# Patient Record
Sex: Female | Born: 1970 | Race: White | Hispanic: No | Marital: Married | Smoking: Never smoker
Health system: Southern US, Community
[De-identification: ages and names within clinical notes are randomized; demographics above are authoritative.]

## PROBLEM LIST (undated history)

## (undated) DIAGNOSIS — C50919 Malignant neoplasm of unspecified site of unspecified female breast: Secondary | ICD-10-CM

## (undated) DIAGNOSIS — F32A Depression, unspecified: Secondary | ICD-10-CM

## (undated) DIAGNOSIS — Z1509 Genetic susceptibility to other malignant neoplasm: Secondary | ICD-10-CM

## (undated) DIAGNOSIS — F329 Major depressive disorder, single episode, unspecified: Secondary | ICD-10-CM

## (undated) DIAGNOSIS — Z1501 Genetic susceptibility to malignant neoplasm of breast: Secondary | ICD-10-CM

## (undated) HISTORY — DX: Depression, unspecified: F32.A

## (undated) HISTORY — DX: Genetic susceptibility to other malignant neoplasm: Z15.09

## (undated) HISTORY — DX: Major depressive disorder, single episode, unspecified: F32.9

## (undated) HISTORY — DX: Genetic susceptibility to malignant neoplasm of breast: Z15.01

## (undated) HISTORY — DX: Malignant neoplasm of unspecified site of unspecified female breast: C50.919

## (undated) HISTORY — PX: ABDOMINAL HYSTERECTOMY: SHX81

---

## 2008-02-17 HISTORY — PX: MASTECTOMY: SHX3

## 2009-02-16 HISTORY — PX: TOTAL ABDOMINAL HYSTERECTOMY W/ BILATERAL SALPINGOOPHORECTOMY: SHX83

## 2012-03-17 ENCOUNTER — Other Ambulatory Visit: Payer: Self-pay | Admitting: Family Medicine

## 2012-03-18 ENCOUNTER — Other Ambulatory Visit: Payer: Self-pay | Admitting: Family Medicine

## 2012-03-18 DIAGNOSIS — Z853 Personal history of malignant neoplasm of breast: Secondary | ICD-10-CM

## 2012-03-18 DIAGNOSIS — Z9011 Acquired absence of right breast and nipple: Secondary | ICD-10-CM

## 2012-03-18 DIAGNOSIS — Z9882 Breast implant status: Secondary | ICD-10-CM

## 2012-04-04 ENCOUNTER — Encounter (INDEPENDENT_AMBULATORY_CARE_PROVIDER_SITE_OTHER): Payer: Self-pay | Admitting: General Surgery

## 2012-04-05 ENCOUNTER — Encounter (INDEPENDENT_AMBULATORY_CARE_PROVIDER_SITE_OTHER): Payer: Self-pay | Admitting: Surgery

## 2012-04-05 ENCOUNTER — Ambulatory Visit (INDEPENDENT_AMBULATORY_CARE_PROVIDER_SITE_OTHER): Payer: BC Managed Care – PPO | Admitting: Surgery

## 2012-04-05 VITALS — BP 120/72 | HR 70 | Temp 98.6°F | Resp 12 | Ht 67.5 in | Wt 148.0 lb

## 2012-04-05 DIAGNOSIS — Z853 Personal history of malignant neoplasm of breast: Secondary | ICD-10-CM | POA: Insufficient documentation

## 2012-04-05 DIAGNOSIS — Z1509 Genetic susceptibility to other malignant neoplasm: Secondary | ICD-10-CM

## 2012-04-05 DIAGNOSIS — Z1501 Genetic susceptibility to malignant neoplasm of breast: Secondary | ICD-10-CM

## 2012-04-05 NOTE — Progress Notes (Signed)
NAME: Deborah Rogers       DOB: 1970-12-12           DATE: 04/05/2012       MRN: [id number]  CC:  Chief Complaint  Patient presents with  . 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 She has no problems or concerns on either side.She is BRCAS II + and had a bilateral oophorectomy after the mastectomy. She has recently moved here and wants to establish follow up care here  PFSH: She has had no significant changes since the last visit here.  ROS: There have been no significant changes since the last visit here  EXAM:  VS: BP 120/72  Pulse 70  Temp(Src) 98.6 F (37 C) (Temporal)  Resp 12  Ht 5' 7.5" (1.715 m)  Wt 148 lb (67.132 kg)  BMI 22.82 kg/m2  General: The patient is alert, oriented, generally healthy appearing, NAD. Mood and affect are normal.  Breasts:  S/P right mastectomy with implant reconstructions- an excellent cosmetic result. Left unremarkable  Lymphatics: She has no axillary or supraclavicular adenopathy on either side.  Extremities: Full ROM of the surgical side with no lymphedema noted.  Data Reviewed: Notes for Primary care. Trying to get notes from [facility]  Impression: Doing well, with no evidence of recurrent cancer or new cancer  Plan: Await mamogram, will see in six months

## 2012-04-05 NOTE — Patient Instructions (Signed)
We will schedule a mammogram and try to get records from [facility]

## 2012-05-13 ENCOUNTER — Inpatient Hospital Stay: Admission: RE | Admit: 2012-05-13 | Payer: Self-pay | Source: Ambulatory Visit

## 2012-05-13 ENCOUNTER — Ambulatory Visit
Admission: RE | Admit: 2012-05-13 | Discharge: 2012-05-13 | Disposition: A | Discharge status: Home / Self Care | Payer: BC Managed Care – PPO | Source: Ambulatory Visit | Attending: Family Medicine | Admitting: Family Medicine

## 2012-05-13 DIAGNOSIS — Z9011 Acquired absence of right breast and nipple: Secondary | ICD-10-CM

## 2012-05-13 DIAGNOSIS — Z9882 Breast implant status: Secondary | ICD-10-CM

## 2012-05-13 DIAGNOSIS — Z853 Personal history of malignant neoplasm of breast: Secondary | ICD-10-CM

## 2013-01-06 ENCOUNTER — Telehealth (INDEPENDENT_AMBULATORY_CARE_PROVIDER_SITE_OTHER): Payer: Self-pay

## 2013-01-06 NOTE — Telephone Encounter (Signed)
Left message on machine for patient to call back and ask for me or triage. Patient can be scheduled in any blocked spot on Dr Tenna Child schedule. These were blocked from new patients only. Awaiting call back.

## 2013-01-06 NOTE — Telephone Encounter (Signed)
Appt made

## 2013-01-06 NOTE — Telephone Encounter (Signed)
Pt calling to make her 6 mo LTF appt with Dr Jamey Ripa. I saw block on appts in Dec. Pt wants to see him if possible understanding he is retiring. Pt can be reached at [phone] or [phone].

## 2013-02-03 ENCOUNTER — Ambulatory Visit (INDEPENDENT_AMBULATORY_CARE_PROVIDER_SITE_OTHER): Payer: BC Managed Care – PPO | Admitting: Surgery

## 2013-02-07 ENCOUNTER — Ambulatory Visit (INDEPENDENT_AMBULATORY_CARE_PROVIDER_SITE_OTHER): Payer: BC Managed Care – PPO | Admitting: Surgery

## 2013-02-07 ENCOUNTER — Encounter (INDEPENDENT_AMBULATORY_CARE_PROVIDER_SITE_OTHER): Payer: Self-pay

## 2013-02-07 ENCOUNTER — Encounter (INDEPENDENT_AMBULATORY_CARE_PROVIDER_SITE_OTHER): Payer: Self-pay | Admitting: Surgery

## 2013-02-07 VITALS — BP 114/78 | HR 60 | Temp 97.5°F | Resp 14 | Ht 67.0 in | Wt 147.2 lb

## 2013-02-07 DIAGNOSIS — Z1501 Genetic susceptibility to malignant neoplasm of breast: Secondary | ICD-10-CM

## 2013-02-07 DIAGNOSIS — Z853 Personal history of malignant neoplasm of breast: Secondary | ICD-10-CM

## 2013-02-07 NOTE — Progress Notes (Signed)
NAME: Deborah Rogers       DOB: 1970-04-03           DATE: 02/07/2013       MRN: [id number]  CC:  Chief Complaint  Patient presents with  . 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 She has no problems or concerns on either side.She is BRCAS II + and had a bilateral oophorectomy after the mastectomy. She has recently moved here and wants to establish follow up care here  PFSH: She has had no significant changes since the last visit here.  ROS: There have been no significant changes since the last visit here  EXAM:  VS: BP 114/78  Pulse 60  Temp(Src) 97.5 F (36.4 C) (Temporal)  Resp 14  Ht 5\' 7"  (1.702 m)  Wt 147 lb 3.2 oz (66.769 kg)  BMI 23.05 kg/m2  General: The patient is alert, oriented, generally healthy appearing, NAD. Mood and affect are normal.  Breasts:  S/P right mastectomy with implant reconstructions- an excellent cosmetic result. Left unremarkable  Lymphatics: She has no axillary or supraclavicular adenopathy on either side.  Extremities: Full ROM of the surgical side with no lymphedema noted.  Data Reviewed: MAMMOGRAPHIC UNILATERAL LEFT DIGITAL SCREENING WITH CAD  DIGITAL BREAST TOMOSYNTHESIS  Digital breast tomosynthesis images are acquired in two  projections. These images are reviewed in combination with the  digital mammogram, confirming the findings below.  A subpectoral silicone implant is present. Implant included and  implant displaced views are obtained.  Comparison: 03/20/2011 from [facility] in [place], [place]  FINDINGS:  ACR Breast Density Category 3: The breast tissue is heterogeneously  dense.  There is no suspicious dominant mass, architectural distortion, or  calcification to suggest malignancy.  Images were processed with CAD.  IMPRESSION:  No mammographic evidence  of malignancy.  A result letter of this screening mammogram will be mailed directly  to the patient.  RECOMMENDATION:  Screening mammogram in one year. (Code:[id number]-01Y)  BI-RADS CATEGORY 1: Negative.    Impression: Doing well, with no evidence of recurrent cancer or new cancer BRCA 2  Personal history of breast cancer  Plan: Given BRCA 2 status  And implants pt needs MRI every other year.  Will arrange. Return 1 year.

## 2013-02-07 NOTE — Patient Instructions (Signed)
Return 1 year.  Need imaging in march.  Will discuss MRI vs mammogram with radiology and set up appropriate tests.

## 2013-04-07 ENCOUNTER — Other Ambulatory Visit: Payer: Self-pay

## 2013-04-07 DIAGNOSIS — Z1231 Encounter for screening mammogram for malignant neoplasm of breast: Secondary | ICD-10-CM

## 2013-05-15 ENCOUNTER — Ambulatory Visit
Admission: RE | Admit: 2013-05-15 | Discharge: 2013-05-15 | Disposition: A | Discharge status: Home / Self Care | Payer: BC Managed Care – PPO | Source: Ambulatory Visit

## 2013-05-15 DIAGNOSIS — Z1231 Encounter for screening mammogram for malignant neoplasm of breast: Secondary | ICD-10-CM

## 2013-12-15 ENCOUNTER — Ambulatory Visit (INDEPENDENT_AMBULATORY_CARE_PROVIDER_SITE_OTHER): Payer: BC Managed Care – PPO | Admitting: Family Medicine

## 2013-12-15 ENCOUNTER — Encounter: Payer: Self-pay | Admitting: Family Medicine

## 2013-12-15 ENCOUNTER — Encounter (INDEPENDENT_AMBULATORY_CARE_PROVIDER_SITE_OTHER): Payer: Self-pay

## 2013-12-15 VITALS — BP 129/85 | Ht 67.5 in | Wt 145.0 lb

## 2013-12-15 DIAGNOSIS — M79661 Pain in right lower leg: Secondary | ICD-10-CM | POA: Diagnosis not present

## 2013-12-15 DIAGNOSIS — S86811A Strain of other muscle(s) and tendon(s) at lower leg level, right leg, initial encounter: Secondary | ICD-10-CM

## 2013-12-15 DIAGNOSIS — S86111A Strain of other muscle(s) and tendon(s) of posterior muscle group at lower leg level, right leg, initial encounter: Secondary | ICD-10-CM | POA: Insufficient documentation

## 2013-12-15 NOTE — Progress Notes (Signed)
Deborah Rogers is a 43 y.o. female who presents today for R calf pain.  R Calf pain - Pt states this has been ongoing now for about 2 months with acute pain yesterday during a run.  Pain is located midcalf region and was insidious in onset without an acute injury she noticed.  Denies previous injury to either leg or previous calf.  Pain prior to injury yesterday described as achy, with activity, not really with rest.  She had not done anything for the achiness during the last couple of months and did not notice that longer periods of rest helped too much.  She has been running about 3-5 miles 2 x per week with a 5-8 mile run on the weekend.  As well, she is running on concrete/black top and changed her shoes (S[place]cony, 8 mm lift) about 2 weeks ago. Yesterday, she was running and during a short ascent, she started to have sharp pain in the lateral aspect of her calf that caused her to stop running.  She denied having any effusion or ecchymosis but states weakness.  She did ice last night which helped and took some ibuprofen.  Denies paresthesias into her lower extremities or any focal bony tenderness.   Past Medical History  Diagnosis Date  . Breast cancer   . BRCA2 positive   . Depression    PSH - Non contributory   Family History  Problem Relation Age of Onset  . Breast cancer Paternal Grandfather   . Colon cancer Paternal Grandfather   . Lung cancer Paternal Grandfather   . Cancer Paternal Grandfather     breast and lung  . Colon cancer Paternal Grandmother   . Colon cancer Paternal Aunt   . Cancer Paternal Aunt     breast    Current Outpatient Prescriptions on File Prior to Visit  Medication Sig Dispense Refill  . buPROPion (WELLBUTRIN XL) 150 MG 24 hr tablet Take 150 mg by mouth daily.      . fexofenadine (ALLEGRA) 180 MG tablet Take 180 mg by mouth daily.      . fluticasone (FLONASE) 50 MCG/ACT nasal spray Place 2 sprays into the nose daily.      Marland Kitchen guaiFENesin (MUCINEX) 600 MG 12 hr  tablet Take 1,200 mg by mouth 2 (two) times daily.       No current facility-administered medications on file prior to visit.    ROS: Per HPI.  All other systems reviewed and are negative.   Physical Exam Filed Vitals:   12/15/13 0834  BP: 129/85    Physical Examination: General appearance - alert, well appearing, and in no distress Musculoskeletal - R Calf -  Normal inspection w/o ecchymosis, obvious deformity, or erythema No TTP at the lateral/medial head of the gastroc at proximal insertion or belly of muscle.  No achilles TTP at insertion ROM normal in both knee and ankle  MS 5/5 in both ER/IR with concentric/eccentric testing  Skin - normal coloration and turgor, no rashes, no suspicious skin lesions noted LE: + 2 DP/PT B/L LE  Neuro intact LE    [place] R Calf long/short axis - achilles tendon intact w/o effusion or strain.  Medial head of gastroc with normal fibrillations and no hypoechoic region noted.  Lateral head at he MSK junction showing small hypoechoic changes in long axis measuring around 2 cm without gross hematoma or effusion noted.

## 2013-12-15 NOTE — Assessment & Plan Note (Signed)
Grade 1-2 strain of the lateral head of the gastroc near the medial border at the musculotendinous junction - Conservative management with eccentric heel exercises - Body Helix compression sleeve to the area - Slow return to running in the next 1-2 weeks - Ice/ibuprofen as neeeded - Consider RTC if minimal improvement in two weeks for gait analysis  - Most likely with heel strike, lateral side with rolling in of her forefoot into forefoot varus causing her to put more stress on her lateral posterior complex -posterior lateral heel lift with EVA material today

## 2013-12-18 NOTE — Progress Notes (Signed)
Orin Attending Note: I have seen and examined this patient. I have discussed this patient with the resident and reviewed the assessment and plan as documented above. I agree with the resident's findings and plan. Patient with moderate varus forefoot deformity likely causing leg pain issues, esp when she increases mileage. Small lateral heel post applied to her existing insole, stretching and icing discussed. Might benefit from custom molded orthotics---I would want to see her gait (ideally in non-injured state).

## 2014-01-26 ENCOUNTER — Other Ambulatory Visit (INDEPENDENT_AMBULATORY_CARE_PROVIDER_SITE_OTHER): Payer: Self-pay | Admitting: Surgery

## 2014-01-26 DIAGNOSIS — Z1501 Genetic susceptibility to malignant neoplasm of breast: Secondary | ICD-10-CM

## 2014-01-26 DIAGNOSIS — Z1509 Genetic susceptibility to other malignant neoplasm: Principal | ICD-10-CM

## 2014-02-06 ENCOUNTER — Emergency Department (HOSPITAL_COMMUNITY)
Admission: EM | Admit: 2014-02-06 | Discharge: 2014-02-06 | Disposition: A | Discharge status: Home / Self Care | Payer: BC Managed Care – PPO | Source: Home / Self Care | Attending: Family Medicine | Admitting: Family Medicine

## 2014-02-06 ENCOUNTER — Encounter (HOSPITAL_COMMUNITY): Payer: Self-pay | Admitting: *Deleted

## 2014-02-06 DIAGNOSIS — J0101 Acute recurrent maxillary sinusitis: Secondary | ICD-10-CM

## 2014-02-06 MED ORDER — HYDROCOD POLST-CHLORPHEN POLST 10-8 MG/5ML PO LQCR: 5.0000 mL | Freq: Two times a day (BID) | ORAL | Status: AC | PRN

## 2014-02-06 MED ORDER — MINOCYCLINE HCL 100 MG PO CAPS: 100.0000 mg | ORAL_CAPSULE | Freq: Two times a day (BID) | ORAL | Status: DC

## 2014-02-06 MED ORDER — IPRATROPIUM BROMIDE 0.06 % NA SOLN: 2.0000 | Freq: Four times a day (QID) | NASAL | Status: DC

## 2014-02-06 NOTE — ED Notes (Signed)
Pt  Reports  Symptoms  Of     Cough   /  Congested         Pain  r  Side  Chest           Nasal  stuffyness               Pt  Reports  The  Pain  Is  Worse   When  She  Coughs

## 2014-02-06 NOTE — ED Provider Notes (Signed)
CSN: [id number]     Arrival date & time 02/06/14  1204 History   First MD Initiated Contact with Patient 02/06/14 1252     Chief Complaint  Patient presents with  . URI   (Consider location/radiation/quality/duration/timing/severity/associated sxs/prior Treatment) Patient is a 43 y.o. female presenting with URI. The history is provided by the patient.  URI Presenting symptoms: congestion, cough, fever and rhinorrhea   Severity:  Mild Duration:  8 days Chronicity:  New Associated symptoms: sinus pain     Past Medical History  Diagnosis Date  . Breast cancer   . BRCA2 positive   . Depression    Past Surgical History  Procedure Laterality Date  . Mastectomy Right 2010  . Total abdominal hysterectomy w/ bilateral salpingoophorectomy  2011  . Abdominal hysterectomy     Family History  Problem Relation Age of Onset  . Breast cancer Paternal Grandfather   . Colon cancer Paternal Grandfather   . Lung cancer Paternal Grandfather   . Cancer Paternal Grandfather     breast and lung  . Colon cancer Paternal Grandmother   . Colon cancer Paternal Aunt   . Cancer Paternal Aunt     breast   History  Substance Use Topics  . Smoking status: Never Smoker   . Smokeless tobacco: Not on file  . Alcohol Use: Yes     Comment: one every evening   OB History    No data available     Review of Systems  Constitutional: Positive for fever. Negative for chills and appetite change.  HENT: Positive for congestion, postnasal drip and rhinorrhea.   Respiratory: Positive for cough. Negative for chest tightness.   Cardiovascular: Positive for chest pain. Negative for palpitations and leg swelling.    Allergies  Review of patient's allergies indicates no known allergies.  Home Medications   Prior to Admission medications   Medication Sig Start Date End Date Taking? Authorizing Provider  buPROPion (WELLBUTRIN XL) 150 MG 24 hr tablet Take 150 mg by mouth daily.    Historical Provider, MD   chlorpheniramine-HYDROcodone (TUSSIONEX PENNKINETIC ER) 10-8 MG/5ML LQCR Take 5 mLs by mouth every 12 (twelve) hours as needed for cough. 02/06/14   Billy Fischer, MD  fexofenadine (ALLEGRA) 180 MG tablet Take 180 mg by mouth daily.    Historical Provider, MD  fluticasone (FLONASE) 50 MCG/ACT nasal spray Place 2 sprays into the nose daily.    Historical Provider, MD  Darcey Nora SUSP  11/22/13   Historical Provider, MD  guaiFENesin (MUCINEX) 600 MG 12 hr tablet Take 1,200 mg by mouth 2 (two) times daily.    Historical Provider, MD  ipratropium (ATROVENT) 0.06 % nasal spray Place 2 sprays into both nostrils 4 (four) times daily. 02/06/14   Billy Fischer, MD  minocycline (MINOCIN,DYNACIN) 100 MG capsule Take 1 capsule (100 mg total) by mouth 2 (two) times daily. 02/06/14   Billy Fischer, MD   BP 131/80 mmHg  Pulse 68  Temp(Src) 98.1 F (36.7 C) (Oral)  Resp 12  SpO2 96% Physical Exam  Constitutional: She is oriented to person, place, and time. She appears well-developed and well-nourished. No distress.  HENT:  Right Ear: External ear normal.  Left Ear: External ear normal.  Mouth/Throat: Oropharynx is clear and moist.  Eyes: Pupils are equal, round, and reactive to light.  Neck: Normal range of motion. Neck supple.  Cardiovascular: Normal heart sounds and intact distal pulses.   Pulmonary/Chest: Effort normal and breath sounds normal.  Lymphadenopathy:    She has no cervical adenopathy.  Neurological: She is alert and oriented to person, place, and time.  Skin: Skin is warm and dry.  Nursing note and vitals reviewed.   ED Course  Procedures (including critical care time) Labs Review Labs Reviewed - No data to display  Imaging Review No results found.   MDM   1. Acute recurrent maxillary sinusitis        Billy Fischer, MD 02/06/14 1318

## 2014-02-06 NOTE — Discharge Instructions (Signed)
Drink plenty of fluids as discussed, use medicine as prescribed, and mucinex or delsym for cough. Return or see your doctor if further problems °

## 2014-02-21 ENCOUNTER — Other Ambulatory Visit: Payer: BC Managed Care – PPO

## 2014-02-21 ENCOUNTER — Ambulatory Visit
Admission: RE | Admit: 2014-02-21 | Discharge: 2014-02-21 | Disposition: A | Discharge status: Home / Self Care | Payer: BLUE CROSS/BLUE SHIELD | Source: Ambulatory Visit | Attending: Surgery | Admitting: Surgery

## 2014-02-21 DIAGNOSIS — Z1501 Genetic susceptibility to malignant neoplasm of breast: Secondary | ICD-10-CM

## 2014-02-21 DIAGNOSIS — Z1509 Genetic susceptibility to other malignant neoplasm: Principal | ICD-10-CM

## 2014-02-21 MED ORDER — GADOBENATE DIMEGLUMINE 529 MG/ML IV SOLN
14.0000 mL | Freq: Once | INTRAVENOUS | Status: AC | PRN
  Administered 2014-02-21: 14 mL via INTRAVENOUS

## 2015-05-21 DIAGNOSIS — C44619 Basal cell carcinoma of skin of left upper limb, including shoulder: Secondary | ICD-10-CM | POA: Diagnosis not present

## 2015-06-11 DIAGNOSIS — L720 Epidermal cyst: Secondary | ICD-10-CM | POA: Diagnosis not present

## 2015-06-11 DIAGNOSIS — L821 Other seborrheic keratosis: Secondary | ICD-10-CM | POA: Diagnosis not present

## 2015-06-11 DIAGNOSIS — D225 Melanocytic nevi of trunk: Secondary | ICD-10-CM | POA: Diagnosis not present

## 2015-06-11 DIAGNOSIS — D1801 Hemangioma of skin and subcutaneous tissue: Secondary | ICD-10-CM | POA: Diagnosis not present

## 2015-06-11 DIAGNOSIS — Z85828 Personal history of other malignant neoplasm of skin: Secondary | ICD-10-CM | POA: Diagnosis not present

## 2015-11-11 DIAGNOSIS — Z0000 Encounter for general adult medical examination without abnormal findings: Secondary | ICD-10-CM | POA: Diagnosis not present

## 2015-11-11 DIAGNOSIS — Z1231 Encounter for screening mammogram for malignant neoplasm of breast: Secondary | ICD-10-CM | POA: Diagnosis not present

## 2015-11-11 DIAGNOSIS — Z1501 Genetic susceptibility to malignant neoplasm of breast: Secondary | ICD-10-CM | POA: Diagnosis not present

## 2015-11-11 DIAGNOSIS — N959 Unspecified menopausal and perimenopausal disorder: Secondary | ICD-10-CM | POA: Diagnosis not present

## 2015-11-12 ENCOUNTER — Other Ambulatory Visit: Payer: Self-pay | Admitting: Family Medicine

## 2015-11-14 ENCOUNTER — Other Ambulatory Visit: Payer: Self-pay | Admitting: Family Medicine

## 2015-11-14 DIAGNOSIS — Z853 Personal history of malignant neoplasm of breast: Secondary | ICD-10-CM

## 2015-11-14 DIAGNOSIS — Z1231 Encounter for screening mammogram for malignant neoplasm of breast: Secondary | ICD-10-CM

## 2015-11-14 DIAGNOSIS — Z1501 Genetic susceptibility to malignant neoplasm of breast: Secondary | ICD-10-CM

## 2015-11-21 ENCOUNTER — Ambulatory Visit
Admission: RE | Admit: 2015-11-21 | Discharge: 2015-11-21 | Disposition: A | Discharge status: Home / Self Care | Payer: BLUE CROSS/BLUE SHIELD | Source: Ambulatory Visit | Attending: Family Medicine | Admitting: Family Medicine

## 2015-11-21 ENCOUNTER — Other Ambulatory Visit: Payer: Self-pay | Admitting: Family Medicine

## 2015-11-21 DIAGNOSIS — Z853 Personal history of malignant neoplasm of breast: Secondary | ICD-10-CM

## 2015-11-21 DIAGNOSIS — Z1501 Genetic susceptibility to malignant neoplasm of breast: Secondary | ICD-10-CM

## 2015-11-21 DIAGNOSIS — Z1231 Encounter for screening mammogram for malignant neoplasm of breast: Secondary | ICD-10-CM

## 2015-12-02 DIAGNOSIS — M8588 Other specified disorders of bone density and structure, other site: Secondary | ICD-10-CM | POA: Diagnosis not present

## 2015-12-02 DIAGNOSIS — E2839 Other primary ovarian failure: Secondary | ICD-10-CM | POA: Diagnosis not present

## 2015-12-20 DIAGNOSIS — J069 Acute upper respiratory infection, unspecified: Secondary | ICD-10-CM | POA: Diagnosis not present

## 2015-12-20 DIAGNOSIS — M791 Myalgia: Secondary | ICD-10-CM | POA: Diagnosis not present

## 2015-12-20 DIAGNOSIS — M85852 Other specified disorders of bone density and structure, left thigh: Secondary | ICD-10-CM | POA: Diagnosis not present

## 2015-12-30 ENCOUNTER — Ambulatory Visit
Admission: RE | Admit: 2015-12-30 | Discharge: 2015-12-30 | Disposition: A | Discharge status: Home / Self Care | Payer: BLUE CROSS/BLUE SHIELD | Source: Ambulatory Visit | Attending: Family Medicine | Admitting: Family Medicine

## 2015-12-30 DIAGNOSIS — Z853 Personal history of malignant neoplasm of breast: Secondary | ICD-10-CM

## 2015-12-30 DIAGNOSIS — Z1501 Genetic susceptibility to malignant neoplasm of breast: Secondary | ICD-10-CM

## 2015-12-30 DIAGNOSIS — Z803 Family history of malignant neoplasm of breast: Secondary | ICD-10-CM | POA: Diagnosis not present

## 2015-12-30 MED ORDER — GADOBENATE DIMEGLUMINE 529 MG/ML IV SOLN
14.0000 mL | Freq: Once | INTRAVENOUS | Status: AC | PRN
  Administered 2015-12-30: 14 mL via INTRAVENOUS

## 2016-11-17 ENCOUNTER — Other Ambulatory Visit: Payer: Self-pay | Admitting: Family Medicine

## 2016-11-17 DIAGNOSIS — Z1231 Encounter for screening mammogram for malignant neoplasm of breast: Secondary | ICD-10-CM

## 2016-11-30 ENCOUNTER — Ambulatory Visit
Admission: RE | Admit: 2016-11-30 | Discharge: 2016-11-30 | Disposition: A | Discharge status: Home / Self Care | Payer: BLUE CROSS/BLUE SHIELD | Source: Ambulatory Visit | Attending: Family Medicine | Admitting: Family Medicine

## 2016-11-30 DIAGNOSIS — Z1231 Encounter for screening mammogram for malignant neoplasm of breast: Secondary | ICD-10-CM

## 2016-12-02 DIAGNOSIS — Z13220 Encounter for screening for lipoid disorders: Secondary | ICD-10-CM | POA: Diagnosis not present

## 2016-12-02 DIAGNOSIS — Z0000 Encounter for general adult medical examination without abnormal findings: Secondary | ICD-10-CM | POA: Diagnosis not present

## 2016-12-02 DIAGNOSIS — Z131 Encounter for screening for diabetes mellitus: Secondary | ICD-10-CM | POA: Diagnosis not present

## 2016-12-02 DIAGNOSIS — E7800 Pure hypercholesterolemia, unspecified: Secondary | ICD-10-CM | POA: Diagnosis not present

## 2016-12-02 DIAGNOSIS — Z23 Encounter for immunization: Secondary | ICD-10-CM | POA: Diagnosis not present

## 2016-12-02 DIAGNOSIS — M859 Disorder of bone density and structure, unspecified: Secondary | ICD-10-CM | POA: Diagnosis not present

## 2017-04-23 DIAGNOSIS — J029 Acute pharyngitis, unspecified: Secondary | ICD-10-CM | POA: Diagnosis not present

## 2017-06-29 DIAGNOSIS — J018 Other acute sinusitis: Secondary | ICD-10-CM | POA: Diagnosis not present

## 2017-06-29 DIAGNOSIS — H10022 Other mucopurulent conjunctivitis, left eye: Secondary | ICD-10-CM | POA: Diagnosis not present

## 2017-10-13 DIAGNOSIS — J209 Acute bronchitis, unspecified: Secondary | ICD-10-CM | POA: Diagnosis not present

## 2017-10-26 ENCOUNTER — Other Ambulatory Visit: Payer: Self-pay | Admitting: Family Medicine

## 2017-10-26 DIAGNOSIS — Z1231 Encounter for screening mammogram for malignant neoplasm of breast: Secondary | ICD-10-CM

## 2017-12-06 ENCOUNTER — Ambulatory Visit
Admission: RE | Admit: 2017-12-06 | Discharge: 2017-12-06 | Disposition: A | Discharge status: Home / Self Care | Payer: BLUE CROSS/BLUE SHIELD | Source: Ambulatory Visit | Attending: Family Medicine | Admitting: Family Medicine

## 2017-12-06 DIAGNOSIS — Z1231 Encounter for screening mammogram for malignant neoplasm of breast: Secondary | ICD-10-CM

## 2017-12-16 DIAGNOSIS — E7800 Pure hypercholesterolemia, unspecified: Secondary | ICD-10-CM | POA: Diagnosis not present

## 2017-12-16 DIAGNOSIS — Z0000 Encounter for general adult medical examination without abnormal findings: Secondary | ICD-10-CM | POA: Diagnosis not present

## 2017-12-16 DIAGNOSIS — M545 Low back pain: Secondary | ICD-10-CM | POA: Diagnosis not present

## 2017-12-16 DIAGNOSIS — M8588 Other specified disorders of bone density and structure, other site: Secondary | ICD-10-CM | POA: Diagnosis not present

## 2018-11-03 ENCOUNTER — Other Ambulatory Visit: Payer: Self-pay | Admitting: Family Medicine

## 2018-11-03 DIAGNOSIS — Z1231 Encounter for screening mammogram for malignant neoplasm of breast: Secondary | ICD-10-CM

## 2018-12-20 ENCOUNTER — Ambulatory Visit: Payer: BLUE CROSS/BLUE SHIELD

## 2018-12-21 ENCOUNTER — Other Ambulatory Visit: Payer: Self-pay | Admitting: Family Medicine

## 2018-12-21 DIAGNOSIS — M8588 Other specified disorders of bone density and structure, other site: Secondary | ICD-10-CM

## 2019-01-03 ENCOUNTER — Other Ambulatory Visit: Payer: Self-pay

## 2019-01-03 DIAGNOSIS — Z20822 Contact with and (suspected) exposure to covid-19: Secondary | ICD-10-CM

## 2019-01-05 LAB — NOVEL CORONAVIRUS, NAA: SARS-CoV-2, NAA: NOT DETECTED

## 2019-02-23 ENCOUNTER — Other Ambulatory Visit: Payer: Self-pay

## 2019-02-23 ENCOUNTER — Ambulatory Visit
Admission: RE | Admit: 2019-02-23 | Discharge: 2019-02-23 | Disposition: A | Discharge status: Home / Self Care | Payer: BLUE CROSS/BLUE SHIELD | Source: Ambulatory Visit | Attending: Family Medicine | Admitting: Family Medicine

## 2019-02-23 DIAGNOSIS — Z1231 Encounter for screening mammogram for malignant neoplasm of breast: Secondary | ICD-10-CM

## 2019-02-23 DIAGNOSIS — M8588 Other specified disorders of bone density and structure, other site: Secondary | ICD-10-CM

## 2020-01-04 ENCOUNTER — Other Ambulatory Visit: Payer: Self-pay | Admitting: Family Medicine

## 2020-01-04 DIAGNOSIS — Z1501 Genetic susceptibility to malignant neoplasm of breast: Secondary | ICD-10-CM

## 2020-01-04 DIAGNOSIS — Z853 Personal history of malignant neoplasm of breast: Secondary | ICD-10-CM

## 2020-02-07 ENCOUNTER — Ambulatory Visit
Admission: RE | Admit: 2020-02-07 | Discharge: 2020-02-07 | Disposition: A | Discharge status: Home / Self Care | Payer: Commercial Managed Care - PPO | Source: Ambulatory Visit | Attending: Family Medicine | Admitting: Family Medicine

## 2020-02-07 DIAGNOSIS — Z853 Personal history of malignant neoplasm of breast: Secondary | ICD-10-CM

## 2020-02-07 DIAGNOSIS — Z1501 Genetic susceptibility to malignant neoplasm of breast: Secondary | ICD-10-CM

## 2020-02-07 MED ORDER — GADOBUTROL 1 MMOL/ML IV SOLN
6.0000 mL | Freq: Once | INTRAVENOUS | Status: AC | PRN
  Administered 2020-02-07: 6 mL via INTRAVENOUS

## 2021-01-16 ENCOUNTER — Other Ambulatory Visit: Payer: Self-pay | Admitting: Family Medicine

## 2021-01-16 DIAGNOSIS — Z1501 Genetic susceptibility to malignant neoplasm of breast: Secondary | ICD-10-CM

## 2021-01-16 DIAGNOSIS — Z853 Personal history of malignant neoplasm of breast: Secondary | ICD-10-CM

## 2021-03-10 ENCOUNTER — Other Ambulatory Visit: Payer: Self-pay | Admitting: Family Medicine

## 2021-03-10 ENCOUNTER — Ambulatory Visit
Admission: RE | Admit: 2021-03-10 | Discharge: 2021-03-10 | Disposition: A | Discharge status: Home / Self Care | Payer: No Typology Code available for payment source | Source: Ambulatory Visit | Attending: Family Medicine | Admitting: Family Medicine

## 2021-03-10 DIAGNOSIS — Z1501 Genetic susceptibility to malignant neoplasm of breast: Secondary | ICD-10-CM

## 2021-03-10 DIAGNOSIS — Z853 Personal history of malignant neoplasm of breast: Secondary | ICD-10-CM

## 2021-05-20 ENCOUNTER — Encounter: Payer: Self-pay | Admitting: Plastic Surgery

## 2021-05-20 ENCOUNTER — Ambulatory Visit (INDEPENDENT_AMBULATORY_CARE_PROVIDER_SITE_OTHER): Payer: No Typology Code available for payment source | Admitting: Plastic Surgery

## 2021-05-20 VITALS — BP 130/89 | HR 66 | Ht 67.0 in | Wt 154.8 lb

## 2021-05-20 DIAGNOSIS — Z1509 Genetic susceptibility to other malignant neoplasm: Secondary | ICD-10-CM

## 2021-05-20 DIAGNOSIS — Z1501 Genetic susceptibility to malignant neoplasm of breast: Secondary | ICD-10-CM

## 2021-05-20 DIAGNOSIS — Z853 Personal history of malignant neoplasm of breast: Secondary | ICD-10-CM

## 2021-05-20 NOTE — Progress Notes (Signed)
? ?  Patient ID: Deborah Rogers, female    DOB: 04/10/1970, 51 y.o.   MRN: [id number] ? ? ?Chief Complaint  ?Patient presents with  ? Advice Only  ? Breast Problem  ? ? ?The patient is a 51 year old female here for evaluation of her breasts.  13 years ago she underwent a right mastectomy in [place].  She had reconstruction and bilateral placement of silicone implants.  She thinks that they are both under the muscle.  She is not sure about the size.  In the last year she has noticed a different feeling on the right breast.  It is not exactly pain but may be a discomfort.  She has a grade 2 ptosis of the left breast.  She also has a right nipple areola tattoo.  She is not a smoker, not on blood thinner and does not have diabetes.  She is otherwise in good health.  She considers herself a C cup.  She is living in [place] and would like to be established with a Psychologist, sport and exercise here.  She is 5 feet 7 inches tall and weighs 154 pounds.  She gets mammograms and MRIs every other year.  She is an avid horse rider. ? ? ?Review of Systems  ?Constitutional: Negative.   ?HENT: Negative.    ?Eyes: Negative.   ?Respiratory: Negative.  Negative for chest tightness and shortness of breath.   ?Cardiovascular: Negative.   ?Gastrointestinal: Negative.   ?Endocrine: Negative.   ?Genitourinary: Negative.   ?Musculoskeletal: Negative.   ?Skin: Negative.   ? ?Past Medical History:  ?Diagnosis Date  ? BRCA2 positive   ? Breast cancer ([place])   ? Depression   ?  ?Past Surgical History:  ?Procedure Laterality Date  ? ABDOMINAL HYSTERECTOMY    ? MASTECTOMY Right 2010  ? TOTAL ABDOMINAL HYSTERECTOMY W/ BILATERAL SALPINGOOPHORECTOMY  2011  ?  ? ? ?Current Outpatient Medications:  ?  chlorpheniramine-HYDROcodone (TUSSIONEX PENNKINETIC ER) 10-8 MG/5ML LQCR, Take 5 mLs by mouth every 12 (twelve) hours as needed for cough., Disp: 115 mL, Rfl: 0 ?  fexofenadine (ALLEGRA) 180 MG tablet, Take 180 mg by mouth daily., Disp: , Rfl:  ?   fluticasone (FLONASE) 50 MCG/ACT nasal spray, Place 2 sprays into the nose daily., Disp: , Rfl:  ?  guaiFENesin (MUCINEX) 600 MG 12 hr tablet, Take 1,200 mg by mouth 2 (two) times daily., Disp: , Rfl:   ? ?Objective:  ? ?Vitals:  ? 05/20/21 1132  ?BP: 130/89  ?Pulse: 66  ?SpO2: 98%  ? ? ?Physical Exam ?Constitutional:   ?   Appearance: Normal appearance.  ?HENT:  ?   Head: Normocephalic and atraumatic.  ?Cardiovascular:  ?   Rate and Rhythm: Normal rate.  ?   Pulses: Normal pulses.  ?Pulmonary:  ?   Effort: Pulmonary effort is normal.  ?Abdominal:  ?   General: There is no distension.  ?   Palpations: Abdomen is soft.  ?   Tenderness: There is no abdominal tenderness.  ?Musculoskeletal:     ?   General: No swelling or deformity.  ?Skin: ?   General: Skin is warm.  ?   Capillary Refill: Capillary refill takes less than 2 seconds.  ?   Coloration: Skin is not jaundiced or pale.  ?   Findings: No bruising.  ?Neurological:  ?   Mental Status: She is alert and oriented to person, place, and time.  ?Psychiatric:     ?   Mood and Affect:  Mood normal.     ?   Behavior: Behavior normal.     ?   Thought Content: Thought content normal.     ?   Judgment: Judgment normal.  ? ? ?Assessment & Plan:  ?Hx DCIS, Right breast ? ?BRCA2 positive ? ? ?I saw in her chart that she is BRCA2 positive.  We will talk more about this at her next visit.  Right now we will get a release of information to get her information from [place].  She should go ahead with her MRI for this year.  I would like to see her back in a year.  We talked about the need to do an exchange.  This does not have to be rushed and could be done in the next year or so when it is good for her.  It might be a good time to do a mastopexy of the left breast.  The patient knows to give me a call if she has any questions or concerns in the interim. ? ?Pictures were obtained of the patient and placed in the chart with the patient's or guardian's permission. ? ? ? ?Loel Lofty  Vali Capano, DO ?

## 2021-11-26 ENCOUNTER — Other Ambulatory Visit: Payer: No Typology Code available for payment source

## 2021-12-09 ENCOUNTER — Telehealth: Payer: Self-pay | Admitting: Plastic Surgery

## 2021-12-09 NOTE — Telephone Encounter (Signed)
T ptn advised Dr. Keturah Barre asked to see ptn for follow up in one year and advised to proceed with her yearly MRI- but she did not order an MRI - it appears dr. Baldomero Lamy office did

## 2022-01-27 ENCOUNTER — Other Ambulatory Visit: Payer: Self-pay | Admitting: Family Medicine

## 2022-01-27 DIAGNOSIS — Z1231 Encounter for screening mammogram for malignant neoplasm of breast: Secondary | ICD-10-CM

## 2022-02-04 ENCOUNTER — Other Ambulatory Visit: Payer: Self-pay | Admitting: Family Medicine

## 2022-02-04 DIAGNOSIS — Z853 Personal history of malignant neoplasm of breast: Secondary | ICD-10-CM

## 2022-02-04 DIAGNOSIS — Z1509 Genetic susceptibility to other malignant neoplasm: Secondary | ICD-10-CM

## 2022-02-22 IMAGING — MG DIGITAL SCREENING UNILAT LEFT IMPLANT  W/ TOMO W/ CAD
6 series · 6 of 14 positions shown · non-contrast
Comparison: Previous exam(s).

CLINICAL DATA: Screening.

EXAM:
DIGITAL SCREENING UNILATERAL LEFT MAMMOGRAM WITH IMPLANTS, CAD AND
TOMOSYNTHESIS
TECHNIQUE: Left screening digital craniocaudal and mediolateral oblique
mammograms were obtained. Left screening digital breast
tomosynthesis was performed. The images were evaluated with
computer-aided detection. Standard and/or implant displaced views
were performed.

[L CC]
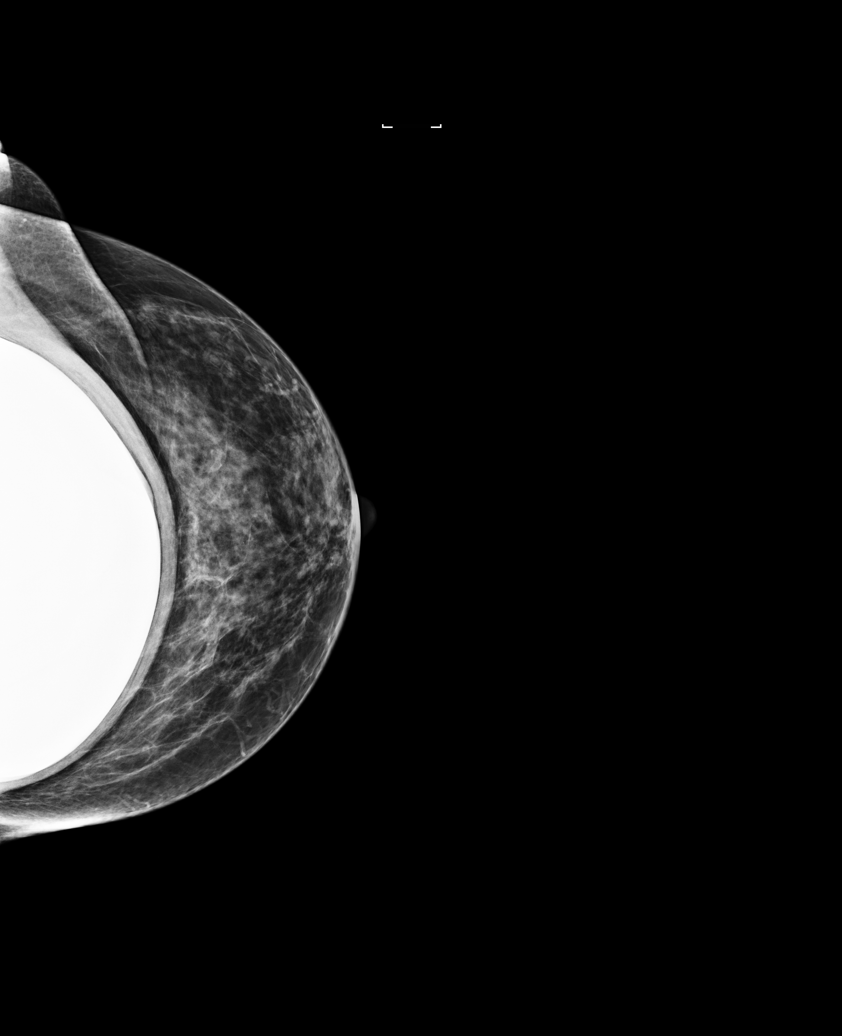

[L MLO]
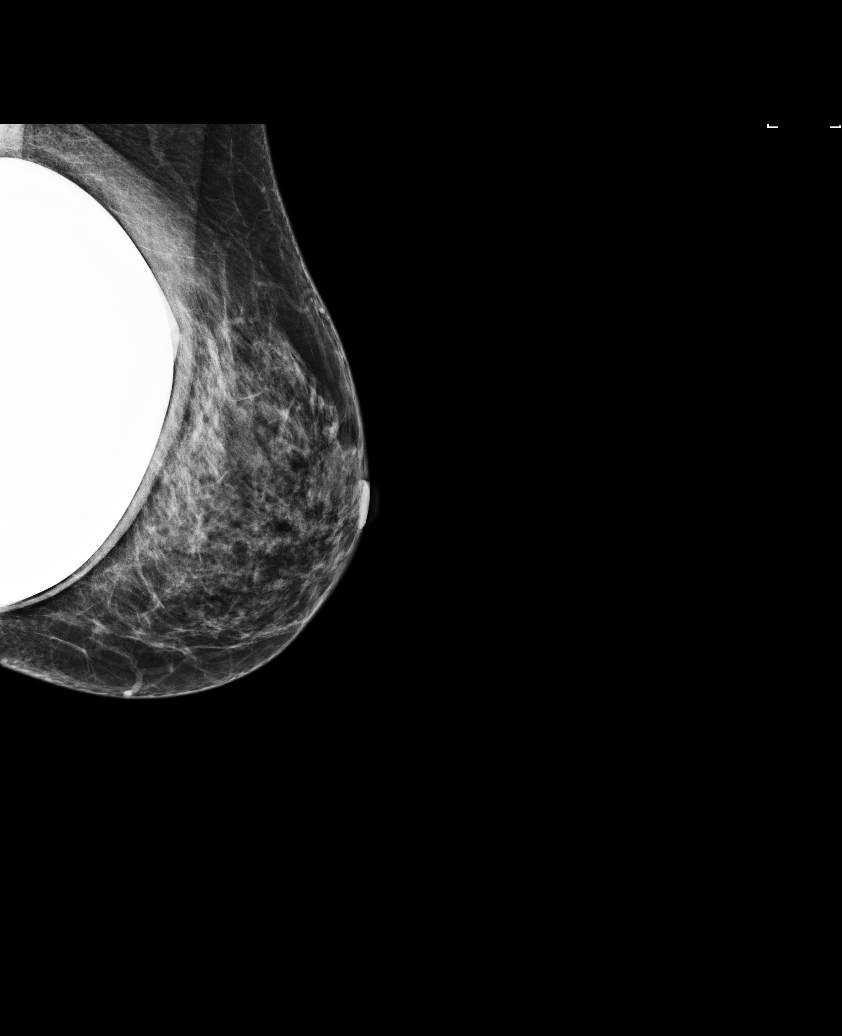

[L MLO synth-2D]
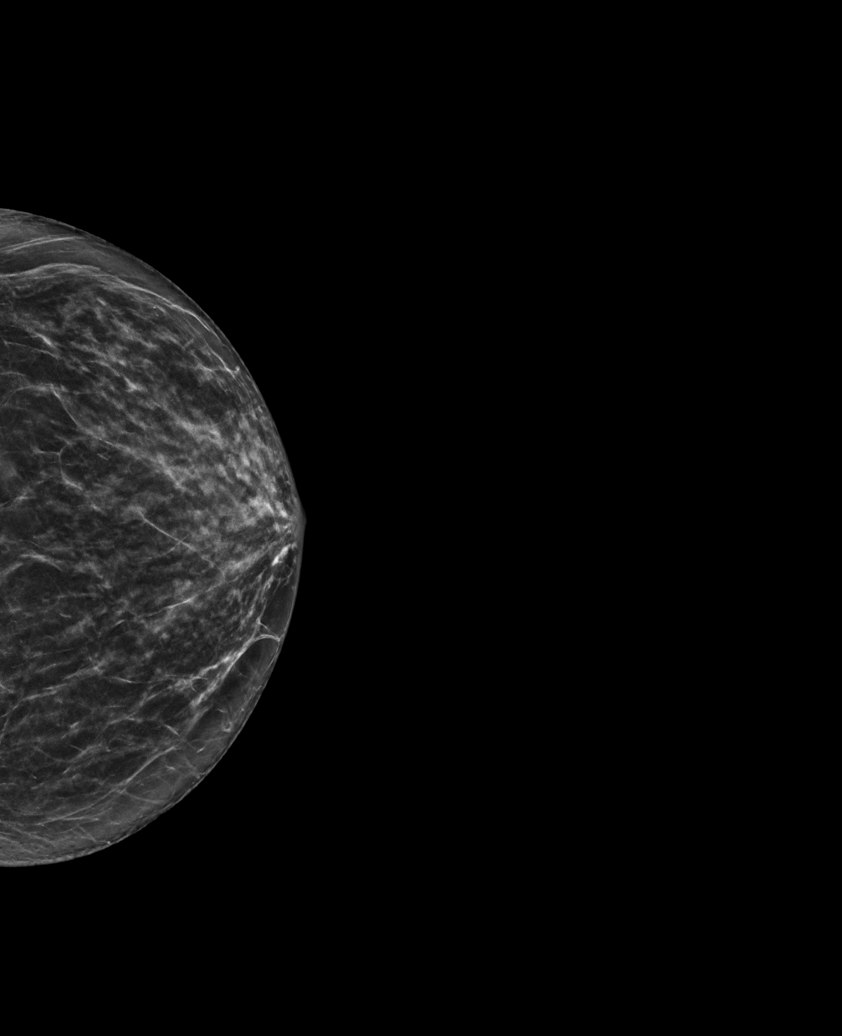

[L CC synth-2D]
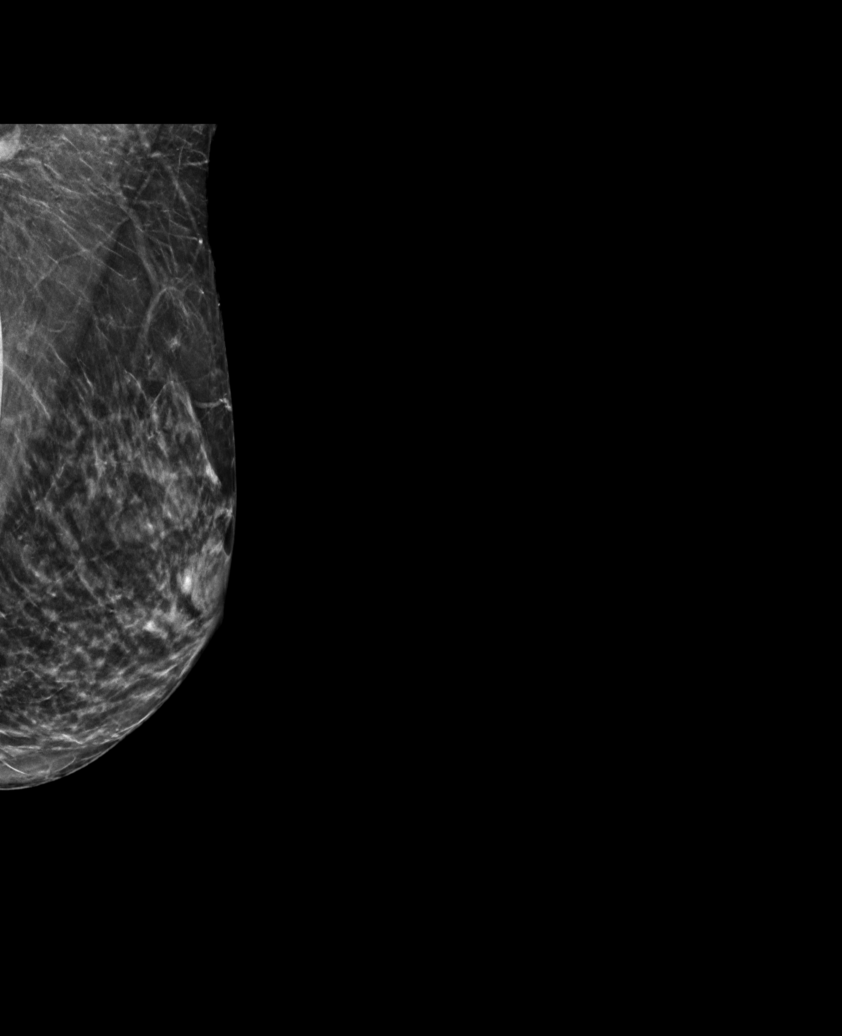

[L CCID BREAST TOMOSYNTHESIS IMAGE tomo · tomo slice 29/57.0]
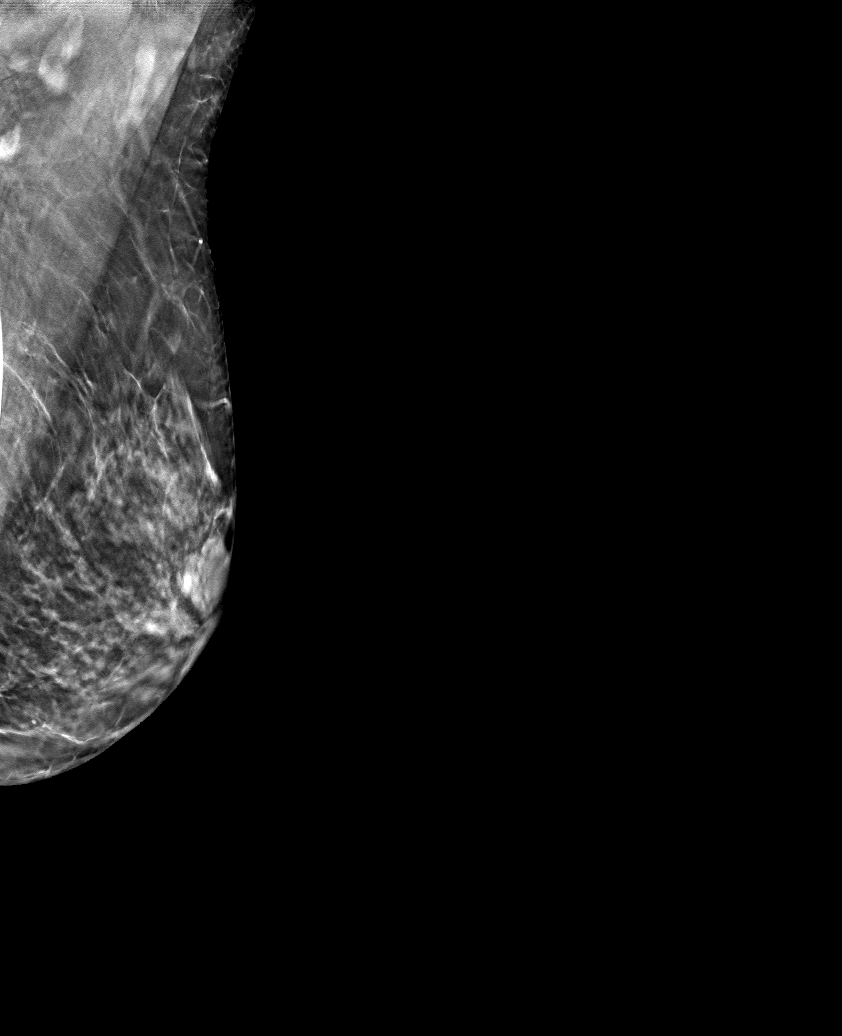

[L MLOID BREAST TOMOSYNTHESIS IMAGE tomo · tomo slice 21/42.0]
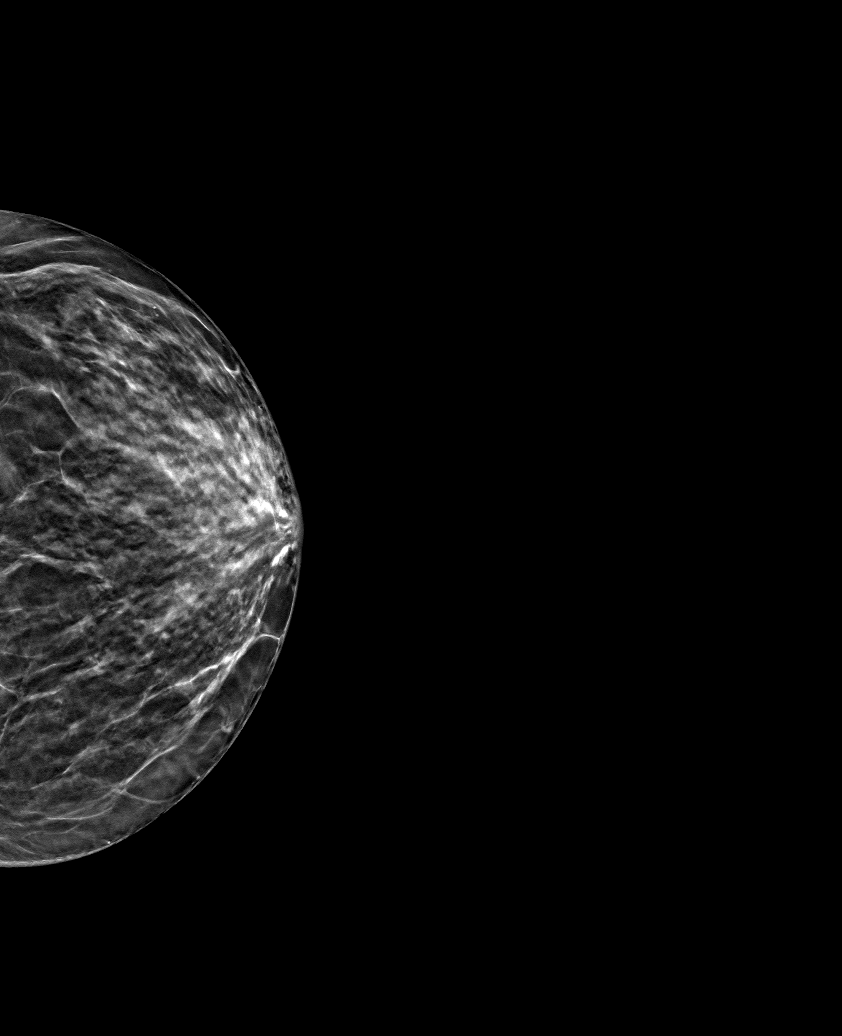

[6 of 14 positions shown; findings below may reference images not displayed]

ACR Breast Density Category c: The breast tissue is heterogeneously
dense, which may obscure small masses.
FINDINGS: The patient has implants. There are no findings suspicious for
malignancy.
IMPRESSION: No mammographic evidence of malignancy. A result letter of this
screening mammogram will be mailed directly to the patient.

RECOMMENDATION:
Screening mammogram in one year. (Code:[id number])

BI-RADS CATEGORY  1:  Negative.

## 2022-04-06 ENCOUNTER — Ambulatory Visit
Admission: RE | Admit: 2022-04-06 | Discharge: 2022-04-06 | Disposition: A | Discharge status: Home / Self Care | Payer: No Typology Code available for payment source | Source: Ambulatory Visit | Attending: Family Medicine | Admitting: Family Medicine

## 2022-04-06 DIAGNOSIS — Z1231 Encounter for screening mammogram for malignant neoplasm of breast: Secondary | ICD-10-CM

## 2022-04-24 ENCOUNTER — Ambulatory Visit
Admission: RE | Admit: 2022-04-24 | Discharge: 2022-04-24 | Disposition: A | Discharge status: Home / Self Care | Payer: No Typology Code available for payment source | Source: Ambulatory Visit | Attending: Family Medicine | Admitting: Family Medicine

## 2022-04-24 DIAGNOSIS — Z1509 Genetic susceptibility to other malignant neoplasm: Secondary | ICD-10-CM

## 2022-04-24 DIAGNOSIS — Z853 Personal history of malignant neoplasm of breast: Secondary | ICD-10-CM

## 2022-04-24 MED ORDER — GADOPICLENOL 0.5 MMOL/ML IV SOLN
7.5000 mL | Freq: Once | INTRAVENOUS | Status: AC | PRN
  Administered 2022-04-24: 7.5 mL via INTRAVENOUS

## 2022-05-15 ENCOUNTER — Telehealth: Payer: Self-pay | Admitting: Plastic Surgery

## 2022-05-15 NOTE — Telephone Encounter (Signed)
Pt called to see if she needs this appointment, if she does she would need to r/s for another day. If she does not need the appointment she will cancel. Pt just wanted to know if she really needed the apt and what Dr Marla Roe was going to discuss with her in the apt.

## 2022-05-20 NOTE — Telephone Encounter (Signed)
I spoke to Ms. Kalish and reviewed her breast MRI results. No concerns voiced by patient. Advised that she can call us with any concerns or if she would like to schedule appointment with Dr. Marla Roe to discuss results or address other questions. She declined, stating that she just wanted to make sure there weren't any concerning findings on the MRI. States she will call us in the future if needed and voiced thanks for the phone call.

## 2022-05-20 NOTE — Telephone Encounter (Signed)
Pt just wanted to make sure her mammo results were ok, and could it be a televisit instead of her having to come in if you need to go over her results.

## 2022-05-22 ENCOUNTER — Ambulatory Visit: Payer: No Typology Code available for payment source | Admitting: Plastic Surgery

## 2023-04-09 ENCOUNTER — Other Ambulatory Visit: Payer: Self-pay | Admitting: Family Medicine

## 2023-04-09 DIAGNOSIS — Z1231 Encounter for screening mammogram for malignant neoplasm of breast: Secondary | ICD-10-CM

## 2023-04-26 ENCOUNTER — Ambulatory Visit
Admission: RE | Admit: 2023-04-26 | Discharge: 2023-04-26 | Disposition: A | Discharge status: Home / Self Care | Payer: No Typology Code available for payment source | Source: Ambulatory Visit | Attending: Family Medicine | Admitting: Family Medicine

## 2023-04-26 DIAGNOSIS — Z1231 Encounter for screening mammogram for malignant neoplasm of breast: Secondary | ICD-10-CM

## 2023-04-29 ENCOUNTER — Other Ambulatory Visit: Payer: Self-pay | Admitting: Family Medicine

## 2023-04-29 DIAGNOSIS — Z853 Personal history of malignant neoplasm of breast: Secondary | ICD-10-CM

## 2023-04-29 DIAGNOSIS — Z1501 Genetic susceptibility to malignant neoplasm of breast: Secondary | ICD-10-CM

## 2023-06-18 ENCOUNTER — Encounter: Payer: Self-pay | Admitting: Family Medicine

## 2023-06-20 ENCOUNTER — Ambulatory Visit
Admission: RE | Admit: 2023-06-20 | Discharge: 2023-06-20 | Disposition: A | Discharge status: Home / Self Care | Source: Ambulatory Visit | Attending: Family Medicine | Admitting: Family Medicine

## 2023-06-20 DIAGNOSIS — Z853 Personal history of malignant neoplasm of breast: Secondary | ICD-10-CM

## 2023-06-20 DIAGNOSIS — Z1501 Genetic susceptibility to malignant neoplasm of breast: Secondary | ICD-10-CM

## 2023-06-20 MED ORDER — GADOPICLENOL 0.5 MMOL/ML IV SOLN
7.0000 mL | Freq: Once | INTRAVENOUS | Status: AC | PRN
  Administered 2023-06-20: 7 mL via INTRAVENOUS

## 2023-11-01 ENCOUNTER — Other Ambulatory Visit: Payer: Self-pay | Admitting: Gastroenterology

## 2023-11-01 DIAGNOSIS — Z148 Genetic carrier of other disease: Secondary | ICD-10-CM

## 2023-11-09 ENCOUNTER — Encounter: Payer: Self-pay | Admitting: Gastroenterology

## 2023-11-21 ENCOUNTER — Ambulatory Visit
Admission: RE | Admit: 2023-11-21 | Discharge: 2023-11-21 | Disposition: A | Discharge status: Home / Self Care | Source: Ambulatory Visit | Attending: Gastroenterology | Admitting: Gastroenterology

## 2023-11-21 DIAGNOSIS — Z148 Genetic carrier of other disease: Secondary | ICD-10-CM

## 2023-11-21 MED ORDER — GADOPICLENOL 0.5 MMOL/ML IV SOLN
7.5000 mL | Freq: Once | INTRAVENOUS | Status: AC | PRN
  Administered 2023-11-21: 7.5 mL via INTRAVENOUS
